# Patient Record
Sex: Female | Born: 1990 | Race: White | Hispanic: No | Marital: Single | Smoking: Never smoker
Health system: Southern US, Community
[De-identification: ages and names within clinical notes are randomized; demographics above are authoritative.]

---

## 2010-01-11 ENCOUNTER — Ambulatory Visit (HOSPITAL_COMMUNITY)
Admission: RE | Admit: 2010-01-11 | Discharge: 2010-01-11 | Payer: Self-pay | Source: Home / Self Care | Admitting: Urology

## 2010-02-27 ENCOUNTER — Encounter: Payer: Self-pay | Admitting: Family Medicine

## 2011-10-11 IMAGING — CR DG ABDOMEN 1V
1 series · 1 of 1 positions shown · non-contrast
Comparison: None.

CLINICAL DATA: 19-year-old female with left renal calculus.
Lithotripsy in [REDACTED].  Left flank pain.

ABDOMEN - 1 VIEW

[view not recorded]
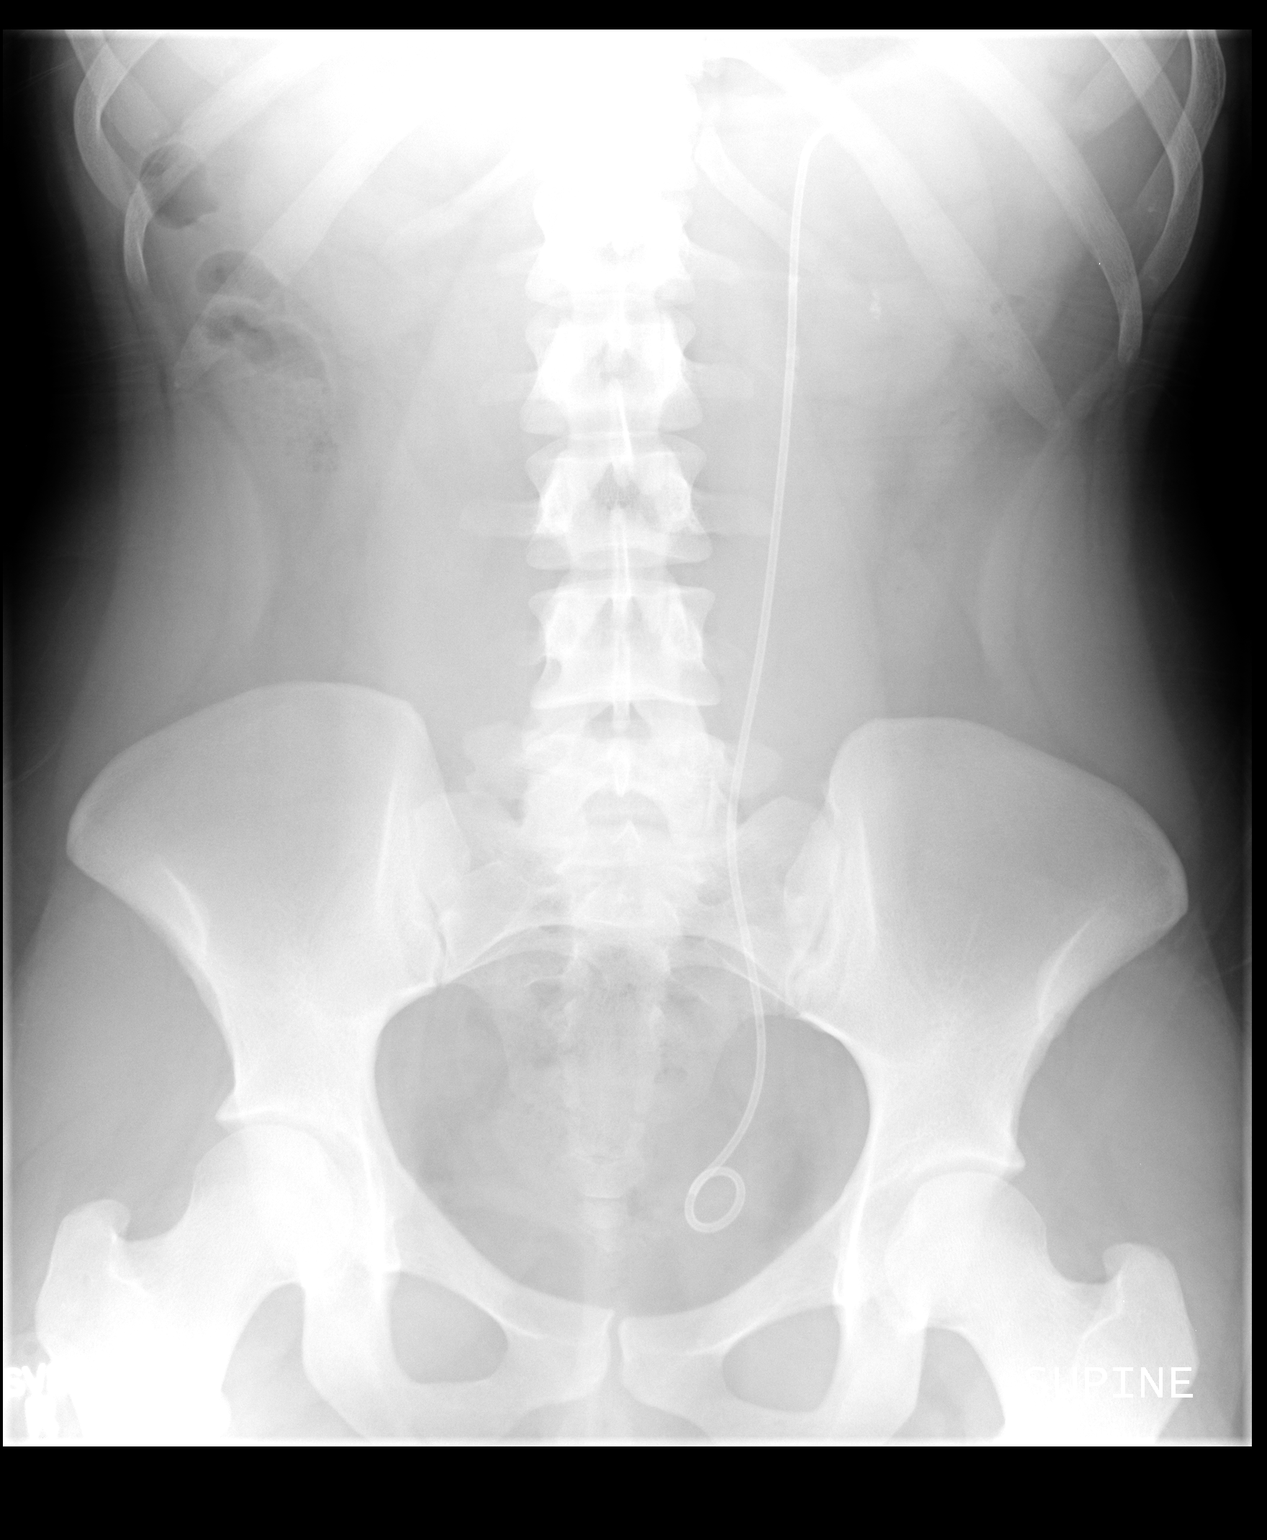

[1 of 1 positions shown; findings below may reference images not displayed]

FINDINGS: Supine view the abdomen and pelvis.  Left double-J
ureteral stent.  The proximal pigtail appear somewhat cephalad of
what appears to be the left renal mid pole judging from the left
renal shadow.  Nephrolithiasis in the left lower pole and along the
proximal aspect of the stent (L1 and L2 levels).

Other abdominal visceral contours are within normal limits.
Paucity of bowel gas. No osseous abnormality identified.
IMPRESSION: 1.  Proximal pigtail of the left double-J ureteral stent seems
located too cephalad on this single view, cannot exclude
malpositioning.
2.  Multiple small calculi along the proximal aspect of the stent
(L1 and L2 levels).  Left lower pole nephrolithiasis.

## 2017-09-14 ENCOUNTER — Emergency Department (HOSPITAL_COMMUNITY)
Admission: EM | Admit: 2017-09-14 | Discharge: 2017-09-14 | Disposition: A | Discharge status: Home / Self Care | Payer: Self-pay | Attending: Emergency Medicine | Admitting: Emergency Medicine

## 2017-09-14 ENCOUNTER — Other Ambulatory Visit: Payer: Self-pay

## 2017-09-14 ENCOUNTER — Encounter (HOSPITAL_COMMUNITY): Payer: Self-pay | Admitting: Emergency Medicine

## 2017-09-14 DIAGNOSIS — K0889 Other specified disorders of teeth and supporting structures: Secondary | ICD-10-CM | POA: Insufficient documentation

## 2017-09-14 MED ORDER — IBUPROFEN 600 MG PO TABS: 600.0000 mg | ORAL_TABLET | Freq: Four times a day (QID) | ORAL | 0 refills | Status: DC | PRN

## 2017-09-14 MED ORDER — PENICILLIN V POTASSIUM 500 MG PO TABS: 500.0000 mg | ORAL_TABLET | Freq: Four times a day (QID) | ORAL | 0 refills | Status: AC

## 2017-09-14 NOTE — Discharge Instructions (Addendum)
Take Ibuprofen every 6 hours for pain and inflammation Take Penicillin four times daily for infection Please follow up with a dentist - a resource guide is attached

## 2017-09-14 NOTE — ED Triage Notes (Signed)
Pt states 1 week of left upper dental pain, hurts to chew or swallow.

## 2017-09-14 NOTE — ED Provider Notes (Signed)
[facility] EMERGENCY DEPARTMENT Provider Note   CSN: [id number] Arrival date & time: 09/14/17  1753     History   Chief Complaint Chief Complaint  Patient presents with  . Dental Pain    HPI Olivia Eaton is a 27 y.o. female who presents with dental pain.  No significant past medical history.  The patient states that she has had 2 to 3 days of left upper and posterior dental pain.  She is unsure if there is an infection.  She also thinks there may be a "hole" in her tooth.  Hurts to chew and swallow.  She reports a fever couple days ago but none recently.  The pain radiates up to her head and is giving her a headache.  She took 6 over the counter ibuprofen today without significant relief.  She does not have a dentist.  HPI  History reviewed. No pertinent past medical history.  There are no active problems to display for this patient.   The histories are not reviewed yet. Please review them in the "History" navigator section and refresh this SmartLink.   OB History   None      Home Medications    Prior to Admission medications   Not on File    Family History No family history on file.  Social History Social History   Tobacco Use  . Smoking status: Not on file  Substance Use Topics  . Alcohol use: Not on file  . Drug use: Not on file     Allergies   Kiwi extract and Sulfur   Review of Systems Review of Systems  HENT: Positive for dental problem. Negative for facial swelling.      Physical Exam Updated Vital Signs BP 115/70   Pulse 81   Temp 98.2 F (36.8 C)   Resp 18   Ht 5' (1.524 m)   Wt 65.8 kg   SpO2 98%   BMI 28.32 kg/m   Physical Exam  Constitutional: She is oriented to person, place, and time. She appears well-developed and well-nourished. No distress.  HENT:  Head: Normocephalic and atraumatic.  Poor dentition. Multiple filled cavities. Permanent retainer over the bottom inscisors. Tenderness over the left  upper wisdom tooth and neighboring molar. No obvious signs of abscess.   Eyes: Pupils are equal, round, and reactive to light. Conjunctivae are normal. Right eye exhibits no discharge. Left eye exhibits no discharge. No scleral icterus.  Neck: Normal range of motion.  Cardiovascular: Normal rate.  Pulmonary/Chest: Effort normal. No respiratory distress.  Abdominal: She exhibits no distension.  Neurological: She is alert and oriented to person, place, and time.  Skin: Skin is warm and dry.  Psychiatric: She has a normal mood and affect. Her behavior is normal.  Nursing note and vitals reviewed.    ED Treatments / Results  Labs (all labs ordered are listed, but only abnormal results are displayed) Labs Reviewed - No data to display  EKG None  Radiology No results found.  Procedures Procedures (including critical care time)  Medications Ordered in ED Medications - No data to display   Initial Impression / Assessment and Plan / ED Course  I have reviewed the triage vital signs and the nursing notes.  Pertinent labs & imaging results that were available during my care of the patient were reviewed by me and considered in my medical decision making (see chart for details).  Dental pain associated with dental caries and possible dental infection. Patient  is afebrile, non toxic appearing, and swallowing secretions well. She was given rx for Ibuprofen and Penicillin. I gave patient referral to dentist and stressed the importance of dental follow up for ultimate management of dental pain.   Final Clinical Impressions(s) / ED Diagnoses   Final diagnoses:  Pain, dental    ED Discharge Orders    None       Bethel BornGekas, Coral Soler Marie, PA-C 09/14/17 1917    Marily MemosMesner, Jason, MD 09/15/17 2007

## 2019-03-10 ENCOUNTER — Telehealth: Payer: Self-pay | Admitting: Adult Health

## 2019-03-10 NOTE — Telephone Encounter (Signed)

## 2019-03-11 ENCOUNTER — Encounter: Payer: Self-pay | Admitting: Adult Health

## 2019-03-11 ENCOUNTER — Other Ambulatory Visit: Payer: Self-pay

## 2019-03-11 ENCOUNTER — Ambulatory Visit (INDEPENDENT_AMBULATORY_CARE_PROVIDER_SITE_OTHER): Payer: Self-pay | Admitting: Adult Health

## 2019-03-11 VITALS — BP 118/82 | HR 74 | Ht <= 58 in | Wt 167.0 lb

## 2019-03-11 DIAGNOSIS — Z319 Encounter for procreative management, unspecified: Secondary | ICD-10-CM

## 2019-03-11 DIAGNOSIS — N911 Secondary amenorrhea: Secondary | ICD-10-CM

## 2019-03-11 DIAGNOSIS — Z975 Presence of (intrauterine) contraceptive device: Secondary | ICD-10-CM

## 2019-03-11 DIAGNOSIS — T50905A Adverse effect of unspecified drugs, medicaments and biological substances, initial encounter: Secondary | ICD-10-CM

## 2019-03-11 DIAGNOSIS — Z3202 Encounter for pregnancy test, result negative: Secondary | ICD-10-CM

## 2019-03-11 LAB — POCT URINE PREGNANCY: Preg Test, Ur: NEGATIVE

## 2019-03-11 NOTE — Progress Notes (Signed)
  Subjective:     Patient ID: Olivia Eaton, female   DOB: [birth year], 29 y.o.   MRN: [id number]  HPI Shataya is a 29 year old white female, engaged, G0P0, in for UPT, had 2 +HPTs and 2 negative HPTs and has nexplanon and has not had a period in 6 years.Has recently had nausea, feels tired and cramping. She would like to be peregnant.  No PCP, did have pap at [facility], about a year ago.   Review of Systems No period in about 6 years has nexplanon +nausea +tired +cramping She had 2 +HPT and 2 negative HPTs  Reviewed past medical,surgical, social and family history. Reviewed medications and allergies.     Objective:   Physical Exam BP 118/82 (BP Location: Left Arm, Patient Position: Sitting, Cuff Size: Normal)   Pulse 74   Ht 4' 9.5" (1.461 m)   Wt 167 lb (75.8 kg)   BMI 35.51 kg/m  UPT is negative Fall risk is low PHQ 9 score is 9, denies being depressed and declines meds, has had some in the past.  Skin warm and dry. Neck: mid line trachea, normal thyroid, good ROM, no lymphadenopathy noted. Lungs: clear to ausculation bilaterally. Cardiovascular: regular rate and rhythm.    Assessment:     1. Pregnancy examination or test, negative result  2. Nexplanon in place  3. Drug induced amenorrhea Check QCHG  4. Patient desires pregnancy Return next week for nexplanon removal, willl rx PNV then     Plan:     Check QHCG Return next week for nexplanon removal

## 2019-03-19 ENCOUNTER — Telehealth: Payer: Self-pay | Admitting: Adult Health

## 2019-03-19 NOTE — Telephone Encounter (Signed)

## 2019-03-20 ENCOUNTER — Encounter: Payer: Medicaid Other | Admitting: Adult Health
